# Patient Record
Sex: Male | Born: 1970 | Hispanic: No | Marital: Single | Smoking: Never smoker
Health system: Southern US, Community
[De-identification: ages and names within clinical notes are randomized; demographics above are authoritative.]

## PROBLEM LIST (undated history)

## (undated) DIAGNOSIS — R200 Anesthesia of skin: Secondary | ICD-10-CM

## (undated) DIAGNOSIS — E119 Type 2 diabetes mellitus without complications: Secondary | ICD-10-CM

## (undated) DIAGNOSIS — E7800 Pure hypercholesterolemia, unspecified: Secondary | ICD-10-CM

## (undated) HISTORY — DX: Pure hypercholesterolemia, unspecified: E78.00

## (undated) HISTORY — DX: Type 2 diabetes mellitus without complications: E11.9

## (undated) HISTORY — DX: Anesthesia of skin: R20.0

---

## 2013-02-14 DIAGNOSIS — I639 Cerebral infarction, unspecified: Secondary | ICD-10-CM

## 2013-02-14 HISTORY — DX: Cerebral infarction, unspecified: I63.9

## 2021-03-02 ENCOUNTER — Other Ambulatory Visit: Payer: Self-pay

## 2021-03-02 ENCOUNTER — Encounter: Payer: Self-pay | Admitting: Podiatry

## 2021-03-02 ENCOUNTER — Ambulatory Visit (INDEPENDENT_AMBULATORY_CARE_PROVIDER_SITE_OTHER): Payer: 59 | Admitting: Podiatry

## 2021-03-02 DIAGNOSIS — B353 Tinea pedis: Secondary | ICD-10-CM | POA: Diagnosis not present

## 2021-03-02 NOTE — Progress Notes (Signed)
°  Subjective:  Patient ID: Kenneth Ramirez, male    DOB: 1970/07/21,   MRN: [id number]  Chief Complaint  Patient presents with   Foot Problem    Pt is concerned due to possible foot fungus in between 4th and 5th toe.Has been having this problem for 3 yrs. Has been using "honey" from pharmacy    51 y.o. male presents for possible fungus on her right foot between her fourth and fifth toes for about 2 years. Has been using a "honey" cream but does not know if it has helped.  Relates he is diabetic and his A1c was high but does not recall number and not documented in the carts. Denies any other pedal complaints. Denies n/v/f/c.   History reviewed. No pertinent past medical history.  Objective:  Physical Exam: Vascular: DP/PT pulses 2/4 bilateral. CFT <3 seconds. Normal hair growth on digits. No edema.  Skin. No lacerations or abrasions bilateral feet. Maceration noted between bilateral fourth interspace and right third interspace.   Musculoskeletal: MMT 5/5 bilateral lower extremities in DF, PF, Inversion and Eversion. Deceased ROM in DF of ankle joint.  Neurological: Sensation intact to light touch.   Assessment:   1. Tinea pedis of both feet      Plan:  Patient was evaluated and treated and all questions answered. -Examined patient -Discussed treatment options for tinea pedis and maceration -Recommend betadine in between the affected areas for the next three weeks.  Will foll-up in 3 weeks to re-evaluate and see if we need to start a fungal cream   Louann Sjogren, DPM

## 2021-03-09 NOTE — Progress Notes (Signed)
No show

## 2021-03-10 ENCOUNTER — Ambulatory Visit: Payer: Self-pay | Admitting: Cardiology

## 2021-03-10 DIAGNOSIS — Z8673 Personal history of transient ischemic attack (TIA), and cerebral infarction without residual deficits: Secondary | ICD-10-CM | POA: Insufficient documentation

## 2021-03-10 DIAGNOSIS — E119 Type 2 diabetes mellitus without complications: Secondary | ICD-10-CM | POA: Insufficient documentation

## 2021-03-10 DIAGNOSIS — R0609 Other forms of dyspnea: Secondary | ICD-10-CM | POA: Insufficient documentation

## 2021-03-10 DIAGNOSIS — E782 Mixed hyperlipidemia: Secondary | ICD-10-CM | POA: Insufficient documentation

## 2021-03-19 NOTE — Progress Notes (Deleted)
Date:  03/19/2021   ID:  Kenneth Ramirez, DOB 14-Aug-1970, MRN [id number]  PCP:  Lubertha Sayres, PA  Cardiologist:  Roslyn Smiling, DO, [facility] (established care 03/22/2021) Former Cardiology Providers: ***  REASON FOR CONSULT: Dyspnea  REQUESTING PHYSICIAN:  Shanon Rosser, PA-C [place],   [phone]  No chief complaint on file.   HPI  Kenneth Ramirez is a 51 y.o. *** male who presents to the office with a chief complaint of "***." Patient's past medical history and cardiovascular risk factors include: ***  He is referred to the office at the request of [place], PA-C for evaluation of dyspnea.  ***  History of  Denies prior history of coronary artery disease, myocardial infarction, congestive heart failure, deep venous thrombosis, pulmonary embolism, stroke, transient ischemic attack.  FUNCTIONAL STATUS: ***   ALLERGIES: Not on File  MEDICATION LIST PRIOR TO VISIT: No outpatient medications have been marked as taking for the 03/22/21 encounter (Appointment) with Rex Kras, DO.     PAST MEDICAL HISTORY: No past medical history on file.  PAST SURGICAL HISTORY: No past surgical history on file.  FAMILY HISTORY: The patient family history is not on file.  SOCIAL HISTORY:  The patient    REVIEW OF SYSTEMS: ROS  PHYSICAL EXAM: No flowsheet data found.  CONSTITUTIONAL: Well-developed and well-nourished. No acute distress.  SKIN: Skin is warm and dry. No rash noted. No cyanosis. No pallor. No jaundice HEAD: Normocephalic and atraumatic.  EYES: No scleral icterus MOUTH/THROAT: Moist oral membranes.  NECK: No JVD present. No thyromegaly noted. No carotid bruits  LYMPHATIC: No visible cervical adenopathy.  CHEST Normal respiratory effort. No intercostal retractions  LUNGS: ***Clear to auscultation bilaterally.  No stridor. No wheezes. No rales.  CARDIOVASCULAR: ***Regular rate and rhythm, positive S1-S2, no murmurs rubs or gallops  appreciated. ABDOMINAL: *** No apparent ascites.  EXTREMITIES: No peripheral edema, warm to touch, ***DP and PT pulses HEMATOLOGIC: No significant bruising NEUROLOGIC: Oriented to person, place, and time. Nonfocal. Normal muscle tone.  PSYCHIATRIC: Normal mood and affect. Normal behavior. Cooperative  CARDIAC DATABASE: EKG: ***  Echocardiogram: No results found for this or any previous visit from the past 1095 days.    Stress Testing: No results found for this or any previous visit from the past 1095 days.   Heart Catheterization: None  LABORATORY DATA: No flowsheet data found.  No flowsheet data found.  Lipid Panel  No results found for: CHOL, TRIG, HDL, CHOLHDL, VLDL, LDLCALC, LDLDIRECT, LABVLDL  No components found for: NTPROBNP No results for input(s): PROBNP in the last 8760 hours. No results for input(s): TSH in the last 8760 hours.  BMP No results for input(s): NA, K, CL, CO2, GLUCOSE, BUN, CREATININE, CALCIUM, GFRNONAA, GFRAA in the last 8760 hours.  HEMOGLOBIN A1C No results found for: HGBA1C, MPG  External Labs:  Date Collected: 02/16/2021 , information obtained by *** Potassium: 4.5 Creatinine 0.92 mg/dL. eGFR: 101 mL/min per 1.73 m Hemoglobin: 15.3 g/dL and hematocrit: 45.7 % Lipid profile: Total cholesterol 282 , triglycerides 151 , HDL 45 , LDL 206 AST: 17 , ALT: 20 , alkaline phosphatase: 107  Hemoglobin A1c: *** TSH: 1.44    IMPRESSION:  No diagnosis found.   RECOMMENDATIONS: Kenneth Ramirez is a 51 y.o. *** male whose past medical history and cardiac risk factors include: ***   FINAL MEDICATION LIST END OF ENCOUNTER: No orders of the defined types were placed in this encounter.   There are no discontinued medications.  Current Outpatient Medications:    Cholecalciferol 1.25 MG (50000 UT) capsule, cholecalciferol (vitamin D3) 1,250 mcg (50,000 unit) capsule  Take 1 capsule every week by oral route., Disp: , Rfl:    clotrimazole  (LOTRIMIN) 1 % cream, clotrimazole 1 % topical cream  APPLY TO THE AFFECTED AND SURROUNDING AREAS OF SKIN BY TOPICAL ROUTE 2 TIMES PER DAY IN THE MORNING AND EVENING, Disp: , Rfl:    empagliflozin (JARDIANCE) 10 MG TABS tablet, Jardiance 10 mg tablet  Take 1 tablet every day by oral route., Disp: , Rfl:    metFORMIN (GLUCOPHAGE) 500 MG tablet, metformin 500 mg tablet  Take 1 tablet twice a day by oral route., Disp: , Rfl:    rosuvastatin (CRESTOR) 20 MG tablet, rosuvastatin 20 mg tablet  Take 1 tablet every day by oral route., Disp: , Rfl:   No orders of the defined types were placed in this encounter.   There are no Patient Instructions on file for this visit.   --Continue cardiac medications as reconciled in final medication list. --No follow-ups on file. Or sooner if needed. --Continue follow-up with your primary care physician regarding the management of your other chronic comorbid conditions.  Patient's questions and concerns were addressed to his satisfaction. He voices understanding of the instructions provided during this encounter.   This note was created using a voice recognition software as a result there may be grammatical errors inadvertently enclosed that do not reflect the nature of this encounter. Every attempt is made to correct such errors.  Rex Kras, [place], [facility]  Pager: [phone] Office: [phone]

## 2021-03-22 ENCOUNTER — Ambulatory Visit: Payer: 59 | Admitting: Cardiology

## 2021-03-23 ENCOUNTER — Ambulatory Visit (INDEPENDENT_AMBULATORY_CARE_PROVIDER_SITE_OTHER): Payer: 59 | Admitting: Podiatry

## 2021-03-23 ENCOUNTER — Other Ambulatory Visit: Payer: Self-pay

## 2021-03-23 ENCOUNTER — Encounter: Payer: Self-pay | Admitting: Podiatry

## 2021-03-23 DIAGNOSIS — B353 Tinea pedis: Secondary | ICD-10-CM

## 2021-03-23 NOTE — Progress Notes (Signed)
°  Subjective:  Patient ID: Kenneth Ramirez, male    DOB: 02-02-1971,   MRN: [id number]  Chief Complaint  Patient presents with   Nail Problem    Nail fungus follow up     51 y.o. male presents for follow-up of tinea pedis and maceration in interspaces. Has been using betadine twice a day.   Relates he is diabetic and his A1c was high but does not recall number and not documented in the carts. Denies any other pedal complaints. Denies n/v/f/c.   History reviewed. No pertinent past medical history.  Objective:  Physical Exam: Vascular: DP/PT pulses 2/4 bilateral. CFT <3 seconds. Normal hair growth on digits. No edema.  Skin. No lacerations or abrasions bilateral feet. Maceration noted between bilateral fourth interspace and bilateral  third interspace.  Does appear improved.  Musculoskeletal: MMT 5/5 bilateral lower extremities in DF, PF, Inversion and Eversion. Deceased ROM in DF of ankle joint.  Neurological: Sensation intact to light touch.   Assessment:   1. Tinea pedis of both feet       Plan:  Patient was evaluated and treated and all questions answered. -Examined patient -Discussed treatment options for tinea pedis and maceration -Continue betadine in between the affected areas for the next three weeks.  Will foll-up in 2 months to re-evaluate and see if we need to start a fungal cream   Lorenda Peck, DPM

## 2021-05-04 ENCOUNTER — Encounter: Payer: Self-pay | Admitting: *Deleted

## 2021-05-10 ENCOUNTER — Ambulatory Visit (INDEPENDENT_AMBULATORY_CARE_PROVIDER_SITE_OTHER): Payer: 59 | Admitting: Diagnostic Neuroimaging

## 2021-05-10 ENCOUNTER — Encounter: Payer: Self-pay | Admitting: Diagnostic Neuroimaging

## 2021-05-10 VITALS — BP 130/76 | HR 75 | Ht 61.0 in | Wt 181.0 lb

## 2021-05-10 DIAGNOSIS — R414 Neurologic neglect syndrome: Secondary | ICD-10-CM | POA: Diagnosis not present

## 2021-05-10 DIAGNOSIS — R480 Dyslexia and alexia: Secondary | ICD-10-CM | POA: Diagnosis not present

## 2021-05-10 DIAGNOSIS — R488 Other symbolic dysfunctions: Secondary | ICD-10-CM

## 2021-05-10 NOTE — Progress Notes (Signed)
? ?[facility] ? ?PATIENT: Kenneth Ramirez ?DOB: 1970/11/06 ? ?REFERRING CLINICIAN: Lovell Sheehan, PA ?HISTORY FROM: patient  ?REASON FOR VISIT: new consult  ? ? ?HISTORICAL ? ?CHIEF COMPLAINT:  ?Chief Complaint  ?Patient presents with  ? Numbness  ?  Rm 7 New Pt  niece- Avrar, interpreter- Bedor  "numbness was in left arm, difficulty gripping and movement, currently is better"   ? ? ?HISTORY OF PRESENT ILLNESS:  ? ?51 year old male here for evaluation of post stroke symptoms. ? ?2006 patient had fever and some speech and reading difficulty.  In 2007 he had more significant left hand weakness, difficulty writing numbers, difficulty with names, veering to the left side, problems with numbers and calculations, right left confusion.  Patient was living in [place] at the time and had MRI of the brain which showed acute stroke and chronic stroke.  He was treated medically. ? ?Since that time symptoms have continued.  Having some fluctuating numbness in left hand.  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  Now patient living in the [place] with his niece and sister. ? ? ? ?REVIEW OF SYSTEMS: Full 14 system review of systems performed and negative with exception of: as per HPI. ? ?ALLERGIES: ?No Known Allergies ? ?HOME MEDICATIONS: ?Outpatient Medications Prior to Visit  ?Medication Sig Dispense Refill  ? Cholecalciferol 1.25 MG (50000 UT) capsule cholecalciferol (vitamin D3) 1,250 mcg (50,000 unit) capsule ? Take 1 capsule every week by oral route.    ? clotrimazole (LOTRIMIN) 1 % cream clotrimazole 1 % topical cream ? APPLY TO THE AFFECTED AND SURROUNDING AREAS OF SKIN BY TOPICAL ROUTE 2 TIMES PER DAY IN THE MORNING AND EVENING    ? empagliflozin (JARDIANCE) 10 MG TABS tablet Jardiance 10 mg tablet ? Take 1 tablet every day by oral route.    ? metFORMIN (GLUCOPHAGE) 500 MG tablet metformin 500 mg tablet ? Take 1 tablet twice a day by  oral route.    ? rosuvastatin (CRESTOR) 20 MG tablet rosuvastatin 20 mg tablet ? Take 1 tablet every day by oral route.    ? ?No facility-administered medications prior to visit.  ? ? ?PAST MEDICAL HISTORY: ?Past Medical History:  ?Diagnosis Date  ? Anesthesia of skin   ? Diabetes (HCC)   ? Hypercholesteremia   ? Stroke [facility]) 2015  ? ? ?PAST SURGICAL HISTORY: ?History reviewed. No pertinent surgical history. ? ?FAMILY HISTORY: ?Family History  ?Problem Relation Age of Onset  ? Diabetes Father   ? ? ?SOCIAL HISTORY: ?Social History  ? ?Socioeconomic History  ? Marital status: Single  ?  Spouse name: Not on file  ? Number of children: 0  ? Years of education: Not on file  ? Highest education level: Not on file  ?Occupational History  ?  Comment: working  ?Tobacco Use  ? Smoking status: Never  ? Smokeless tobacco: Never  ?Substance and Sexual Activity  ? Alcohol use: Never  ? Drug use: Never  ? Sexual activity: Not on file  ?Other Topics Concern  ? Not on file  ?Social History Narrative  ? 05/10/21 Lives with family  ? ?Social Determinants of Health  ? ?Financial Resource Strain: Not on file  ?Food Insecurity: Not on file  ?Transportation Needs: Not on file  ?Physical Activity: Not on file  ?Stress: Not on file  ?Social Connections: Not on file  ?Intimate Partner Violence: Not on file  ? ? ? ?PHYSICAL  EXAM ? ?GENERAL EXAM/CONSTITUTIONAL: ?Vitals:  ?Vitals:  ? 05/10/21 1143  ?BP: 130/76  ?Pulse: 75  ?Weight: 181 lb (82.1 kg)  ?Height: 5\' 1"  (1.549 m)  ? ?Body mass index is 34.2 kg/m?. ?Wt Readings from Last 3 Encounters:  ?05/10/21 181 lb (82.1 kg)  ? ?Patient is in no distress; well developed, nourished and groomed; neck is supple ? ?CARDIOVASCULAR: ?Examination of carotid arteries is normal; no carotid bruits ?Regular rate and rhythm, no murmurs ?Examination of peripheral vascular system by observation and palpation is normal ? ?EYES: ?Ophthalmoscopic exam of optic discs and posterior segments is normal; no papilledema  or hemorrhages ?No results found. ? ?MUSCULOSKELETAL: ?Gait, strength, tone, movements noted in Neurologic exam below ? ?NEUROLOGIC: ?MENTAL STATUS:  ?   ? View : No data to display.  ?  ?  ?  ? ?awake, alert, oriented to person ?ABLE TO UNDERSTAND SOME ENGLISH; OTHER LANGUAGE VIA NIECE AND INTERPRETER IN ARABIC LANGUAGE ?fund of knowledge appropriate ? ?CRANIAL NERVE:  ?2nd - no papilledema on fundoscopic exam ?2nd, 3rd, 4th, 6th - pupils equal and reactive to light, visual fields full to confrontation, extraocular muscles intact, no nystagmus ?5th - facial sensation symmetric ?7th - facial strength symmetric ?8th - hearing intact ?9th - palate elevates symmetrically, uvula midline ?11th - shoulder shrug symmetric ?12th - tongue protrusion midline ? ?MOTOR:  ?normal bulk and tone, full strength in the BUE, BLE ? ?SENSORY:  ?normal and symmetric to light touch, temperature, vibration ? ?COORDINATION:  ?finger-nose-finger, fine finger movements normal ? ?REFLEXES:  ?deep tendon reflexes present and symmetric ? ?GAIT/STATION:  ?narrow based gait ? ? ? ? ?DIAGNOSTIC DATA (LABS, IMAGING, TESTING) ?- I reviewed patient records, labs, notes, testing and imaging myself where available. ? ?No results found for: WBC, HGB, HCT, MCV, PLT ?No results found for: NA, K, CL, CO2, GLUCOSE, BUN, CREATININE, CALCIUM, PROT, ALBUMIN, AST, ALT, ALKPHOS, BILITOT, GFRNONAA, GFRAA ?No results found for: CHOL, HDL, LDLCALC, LDLDIRECT, TRIG, CHOLHDL ?No results found for: HGBA1C ?No results found for: VITAMINB12 ?No results found for: TSH ? ? ? ? ?ASSESSMENT AND PLAN ? ?51 y.o. year old male here with: ? ? ?Dx: ? ?1. Dyslexia and alexia   ?2. Acalculia   ?3. Left-sided neglect   ? ? ?PLAN: ? ?RIGHT BRAIN STROKE (right parietal; 2007) ?- continue aspirin 81, rosuvastatin, metformin ?- check MRI brain (worsening sxs) ? ?LEFT HAND NUMBNESS (intermittent, mild) ?- could be mild left carpal tunnel syndrome; consider left wrist splint at bedtime   ? ?Return for pending if symptoms worsen or fail to improve, pending test results. ? ? ? ?2008, MD 05/10/2021, 12:09 PM ?Certified in Neurology, Neurophysiology and Neuroimaging ? ?[facility] ?[address], [address] ?[place], [place] ?[phone] ? ?

## 2021-05-10 NOTE — Patient Instructions (Signed)
?  RIGHT BRAIN STROKE (right parietal) ?- continue aspirin 81, rosuvastatin, metformin ?- check MRI brain (worsening sxs) ? ?LEFT HAND NUMBNESS (intermittent, mild) ?- could be mild left carpal tunnel syndrome; consider left wrist splint at bedtime  ?

## 2021-05-12 ENCOUNTER — Telehealth: Payer: Self-pay | Admitting: Diagnostic Neuroimaging

## 2021-05-12 NOTE — Telephone Encounter (Signed)
[facility] pending faxed notes 

## 2021-05-20 NOTE — Telephone Encounter (Signed)
Correction the order is sent to [facility], they will reach out to the patient to schedule.  ?

## 2021-05-20 NOTE — Telephone Encounter (Signed)
[facility] auth: [id number] (exp. 05/12/21 to 08/12/21) order sent to [facility], they will reach out to the patient to schedule.  ?

## 2021-05-25 ENCOUNTER — Ambulatory Visit (INDEPENDENT_AMBULATORY_CARE_PROVIDER_SITE_OTHER): Payer: 59 | Admitting: Podiatry

## 2021-05-25 ENCOUNTER — Encounter: Payer: Self-pay | Admitting: Podiatry

## 2021-05-25 DIAGNOSIS — B353 Tinea pedis: Secondary | ICD-10-CM

## 2021-05-25 MED ORDER — KETOCONAZOLE 2 % EX CREA: 1.0000 "application " | TOPICAL_CREAM | Freq: Every day | CUTANEOUS | 2 refills | Status: DC

## 2021-05-25 NOTE — Progress Notes (Signed)
?  Subjective:  ?Patient ID: Slayton Lubitz, male    DOB: 08/06/1970,   MRN: [id number] ? ?Chief Complaint  ?Patient presents with  ? Nail Problem  ? ? ?51 y.o. male presents for follow-up of tinea pedis and maceration in interspaces. Has been using betadine twice a day.   Relates he is diabetic and his A1c was high but does not recall number and not documented in the charts. Denies any other pedal complaints. Denies n/v/f/c.  ? ?Past Medical History:  ?Diagnosis Date  ? Anesthesia of skin   ? Diabetes (HCC)   ? Hypercholesteremia   ? Stroke [facility]) 2015  ? ? ?Objective:  ?Physical Exam: ?Vascular: DP/PT pulses 2/4 bilateral. CFT <3 seconds. Normal hair growth on digits. No edema.  ?Skin. No lacerations or abrasions bilateral feet. Maceration noted between bilateral fourth interspace and bilateral  third interspace.  Does appear improved.  ?Musculoskeletal: MMT 5/5 bilateral lower extremities in DF, PF, Inversion and Eversion. Deceased ROM in DF of ankle joint.  ?Neurological: Sensation intact to light touch.  ? ?Assessment:  ? ?1. Tinea pedis of both feet   ? ? ? ? ?Plan:  ?Patient was evaluated and treated and all questions answered. ?-Examined patient ?-Discussed treatment options for tinea pedis and maceration ?-Continue betadine in between the affected areas for the next three weeks in the morning and will add on ketoconazole at night.  ?-Ketoconazole provided.  ?Will foll-up in 3 weeks to see how doing.  ? ? ?Louann Sjogren, DPM  ? ? ?

## 2021-06-05 ENCOUNTER — Ambulatory Visit (HOSPITAL_COMMUNITY)
Admission: RE | Admit: 2021-06-05 | Discharge: 2021-06-05 | Disposition: A | Discharge status: Home / Self Care | Payer: 59 | Source: Ambulatory Visit | Attending: Diagnostic Neuroimaging | Admitting: Diagnostic Neuroimaging

## 2021-06-05 DIAGNOSIS — R480 Dyslexia and alexia: Secondary | ICD-10-CM | POA: Diagnosis present

## 2021-06-05 DIAGNOSIS — R488 Other symbolic dysfunctions: Secondary | ICD-10-CM | POA: Diagnosis present

## 2021-06-05 DIAGNOSIS — R414 Neurologic neglect syndrome: Secondary | ICD-10-CM

## 2021-06-14 ENCOUNTER — Encounter: Payer: Self-pay | Admitting: *Deleted

## 2021-06-15 ENCOUNTER — Ambulatory Visit (INDEPENDENT_AMBULATORY_CARE_PROVIDER_SITE_OTHER): Payer: 59 | Admitting: Podiatry

## 2021-06-15 ENCOUNTER — Encounter: Payer: Self-pay | Admitting: Podiatry

## 2021-06-15 DIAGNOSIS — B353 Tinea pedis: Secondary | ICD-10-CM | POA: Diagnosis not present

## 2021-06-15 NOTE — Progress Notes (Signed)
?  Subjective:  ?Patient ID: Kenneth Ramirez, male    DOB: 05/26/1970,   MRN: [id number] ? ?Chief Complaint  ?Patient presents with  ? Skin Problem  ?  Follow up tinea bilateral   "It is much improved"  ? ? ?51 y.o. male presents for follow-up of tinea pedis and maceration in interspaces. Has been using betadine and ketoconazole.   States it is doing much better. Relates he is diabetic and his A1c was high but does not recall number and not documented in the charts. Denies any other pedal complaints. Denies n/v/f/c.  ? ?Past Medical History:  ?Diagnosis Date  ? Anesthesia of skin   ? Diabetes (HCC)   ? Hypercholesteremia   ? Stroke [facility]) 2015  ? ? ?Objective:  ?Physical Exam: ?Vascular: DP/PT pulses 2/4 bilateral. CFT <3 seconds. Normal hair growth on digits. No edema.  ?Skin. No lacerations or abrasions bilateral feet. Maceration noted between bilateral fourth interspace and bilateral  third interspace.  Improved. ?Musculoskeletal: MMT 5/5 bilateral lower extremities in DF, PF, Inversion and Eversion. Deceased ROM in DF of ankle joint.  ?Neurological: Sensation intact to light touch.  ? ?Assessment:  ? ?1. Tinea pedis of both feet   ? ? ? ? ? ?Plan:  ?Patient was evaluated and treated and all questions answered. ?-Examined patient ?-Discussed treatment options for tinea pedis and maceration ?-Continue betadine in between the affected areas for the next three weeks in the morning and continue ketoconazole at night as needed.  ?Will follow-up as needed.  ? ? ?Louann Sjogren, DPM  ? ? ?

## 2021-06-21 ENCOUNTER — Encounter: Payer: Self-pay | Admitting: Nurse Practitioner

## 2021-06-21 ENCOUNTER — Ambulatory Visit: Payer: 59 | Attending: Nurse Practitioner | Admitting: Nurse Practitioner

## 2021-06-21 VITALS — BP 123/82 | HR 64 | Temp 98.5°F | Resp 12 | Ht 62.75 in | Wt 173.0 lb

## 2021-06-21 DIAGNOSIS — E1165 Type 2 diabetes mellitus with hyperglycemia: Secondary | ICD-10-CM | POA: Diagnosis not present

## 2021-06-21 DIAGNOSIS — Z1159 Encounter for screening for other viral diseases: Secondary | ICD-10-CM | POA: Diagnosis not present

## 2021-06-21 DIAGNOSIS — Z1211 Encounter for screening for malignant neoplasm of colon: Secondary | ICD-10-CM

## 2021-06-21 DIAGNOSIS — E559 Vitamin D deficiency, unspecified: Secondary | ICD-10-CM | POA: Diagnosis not present

## 2021-06-21 DIAGNOSIS — Z23 Encounter for immunization: Secondary | ICD-10-CM

## 2021-06-21 DIAGNOSIS — Z114 Encounter for screening for human immunodeficiency virus [HIV]: Secondary | ICD-10-CM

## 2021-06-21 DIAGNOSIS — Z8673 Personal history of transient ischemic attack (TIA), and cerebral infarction without residual deficits: Secondary | ICD-10-CM

## 2021-06-21 DIAGNOSIS — E7800 Pure hypercholesterolemia, unspecified: Secondary | ICD-10-CM

## 2021-06-21 MED ORDER — METFORMIN HCL 500 MG PO TABS: 500.0000 mg | ORAL_TABLET | Freq: Two times a day (BID) | ORAL | 1 refills | Status: DC

## 2021-06-21 MED ORDER — EMPAGLIFLOZIN 10 MG PO TABS: 10.0000 mg | ORAL_TABLET | Freq: Every day | ORAL | 1 refills | Status: DC

## 2021-06-21 MED ORDER — CHOLECALCIFEROL 1.25 MG (50000 UT) PO CAPS: 50000.0000 [IU] | ORAL_CAPSULE | ORAL | 0 refills | Status: AC

## 2021-06-21 MED ORDER — ASPIRIN EC 81 MG PO TBEC: 81.0000 mg | DELAYED_RELEASE_TABLET | Freq: Every day | ORAL | 3 refills | Status: AC

## 2021-06-21 MED ORDER — ROSUVASTATIN CALCIUM 20 MG PO TABS: 20.0000 mg | ORAL_TABLET | Freq: Every day | ORAL | 3 refills | Status: DC

## 2021-06-21 NOTE — Progress Notes (Signed)
? ?Assessment & Plan:  ?Kenneth Ramirez was seen today for establish care. ? ?Diagnoses and all orders for this visit: ? ?Type 2 diabetes mellitus with hyperglycemia, without long-term current use of insulin ([place]) ?-     CMP14+EGFR ?-     Ambulatory referral to Ophthalmology ?-     Microalbumin / creatinine urine ratio ?-     empagliflozin (JARDIANCE) 10 MG TABS tablet; Take 1 tablet (10 mg total) by mouth daily. ?-     metFORMIN (GLUCOPHAGE) 500 MG tablet; Take 1 tablet (500 mg total) by mouth 2 (two) times daily with a meal. ?-     Hemoglobin A1c ? ?Hypercholesterolemia ?-     Lipid panel ?-     rosuvastatin (CRESTOR) 20 MG tablet; Take 1 tablet (20 mg total) by mouth daily. ? ?Vitamin D deficiency disease ?-     VITAMIN D 25 Hydroxy (Vit-D Deficiency, Fractures) ?-     Cholecalciferol 1.25 MG (50000 UT) capsule; Take 1 capsule (50,000 Units total) by mouth once a week. ? ?Need for hepatitis C screening test ?-     HCV Ab w Reflex to Quant PCR ? ?Encounter for screening for HIV ?-     HIV antibody (with reflex) ? ?Colon cancer screening ?-     Fecal occult blood, imunochemical ? ?Need for shingles vaccine ?-     Varicella-zoster vaccine IM (Shingrix) ? ?History of CVA (cerebrovascular accident) ?-     aspirin EC 81 MG tablet; Take 1 tablet (81 mg total) by mouth daily. Swallow whole. ? ? ? ?Patient has been counseled on age-appropriate routine health concerns for screening and prevention. These are reviewed and up-to-date. Referrals have been placed accordingly. Immunizations are up-to-date or declined.    ?Subjective:  ? ?Chief Complaint  ?Patient presents with  ? Establish Care  ? ?HPI ?Kenneth Ramirez 50 y.o. male presents to office today to establish care. ?He is accompanied by an onsite [place] interpreter.  He has a history of diabetes mellitus, hyperlipidemia and stroke (mild residual left hand weakness). ? ?DM 2 ?Notes postprandial blood glucose levels 160. Only checks one time per day. Diabetes is not well  controlled.  He endorses medication adherence taking Jardiance 10 mg daily and metformin 500 mg twice daily. ?Last noted A1c on 02-21-2021: 9.1 ? ?HPL ?Last noted 02-21-2021: LDL 206, triglycerides 151, HDL 45, total cholesterol 282 ?He endorses adherence with high intensity statin Crestor 20 mg daily. ?  ?Review of Systems  ?Constitutional:  Negative for fever, malaise/fatigue and weight loss.  ?HENT: Negative.  Negative for nosebleeds.   ?Eyes: Negative.  Negative for blurred vision, double vision and photophobia.  ?Respiratory: Negative.  Negative for cough and shortness of breath.   ?Cardiovascular: Negative.  Negative for chest pain, palpitations and leg swelling.  ?Gastrointestinal: Negative.  Negative for heartburn, nausea and vomiting.  ?Musculoskeletal: Negative.  Negative for myalgias.  ?Neurological:  Positive for focal weakness (left hand). Negative for dizziness, seizures and headaches.  ?Psychiatric/Behavioral: Negative.  Negative for suicidal ideas.   ? ? ?Past Medical History:  ?Diagnosis Date  ? Anesthesia of skin   ? Diabetes ([place])   ? Hypercholesteremia   ? Stroke [facility]) 2015  ? ? ?History reviewed. No pertinent surgical history. ? ?Family History  ?Problem Relation Age of Onset  ? Diabetes Father   ? ? ?Social History Reviewed with no changes to be made today.  ? ?Outpatient Medications Prior to Visit  ?Medication Sig Dispense Refill  ? clotrimazole (  LOTRIMIN) 1 % cream clotrimazole 1 % topical cream ? APPLY TO THE AFFECTED AND SURROUNDING AREAS OF SKIN BY TOPICAL ROUTE 2 TIMES PER DAY IN THE MORNING AND EVENING    ? ketoconazole (NIZORAL) 2 % cream Apply 1 application. topically daily. 60 g 2  ? aspirin EC 81 MG tablet Take 81 mg by mouth daily. Swallow whole.    ? Cholecalciferol 1.25 MG (50000 UT) capsule cholecalciferol (vitamin D3) 1,250 mcg (50,000 unit) capsule ? Take 1 capsule every week by oral route.    ? empagliflozin (JARDIANCE) 10 MG TABS tablet Jardiance 10 mg tablet ? Take 1 tablet  every day by oral route.    ? metFORMIN (GLUCOPHAGE) 500 MG tablet metformin 500 mg tablet ? Take 1 tablet twice a day by oral route.    ? rosuvastatin (CRESTOR) 20 MG tablet rosuvastatin 20 mg tablet ? Take 1 tablet every day by oral route.    ? ?No facility-administered medications prior to visit.  ? ? ?No Known Allergies ? ?   ?Objective:  ?  ?BP 123/82 (BP Location: Right Arm, Patient Position: Sitting, Cuff Size: Normal)   Pulse 64   Temp 98.5 ?F (36.9 ?C) (Oral)   Resp 12   Ht 5' 2.75" (1.594 m)   Wt 173 lb (78.5 kg)   SpO2 100%   BMI 30.89 kg/m?  ?Wt Readings from Last 3 Encounters:  ?06/21/21 173 lb (78.5 kg)  ?05/10/21 181 lb (82.1 kg)  ? ? ?Physical Exam ?Vitals and nursing note reviewed.  ?Constitutional:   ?   Appearance: He is well-developed.  ?HENT:  ?   Head: Normocephalic and atraumatic.  ?Cardiovascular:  ?   Rate and Rhythm: Normal rate and regular rhythm.  ?   Heart sounds: Normal heart sounds. No murmur heard. ?  No friction rub. No gallop.  ?Pulmonary:  ?   Effort: Pulmonary effort is normal. No tachypnea or respiratory distress.  ?   Breath sounds: Normal breath sounds. No decreased breath sounds, wheezing, rhonchi or rales.  ?Chest:  ?   Chest wall: No tenderness.  ?Abdominal:  ?   General: Bowel sounds are normal.  ?   Palpations: Abdomen is soft.  ?Musculoskeletal:     ?   General: Normal range of motion.  ?   Cervical back: Normal range of motion.  ?Skin: ?   General: Skin is warm and dry.  ?Neurological:  ?   Mental Status: He is alert and oriented to person, place, and time.  ?   Coordination: Coordination normal.  ?Psychiatric:     ?   Behavior: Behavior normal. Behavior is cooperative.     ?   Thought Content: Thought content normal.     ?   Judgment: Judgment normal.  ? ? ? ? ?   ?Patient has been counseled extensively about nutrition and exercise as well as the importance of adherence with medications and regular follow-up. The patient was given clear instructions to go to ER  or return to medical center if symptoms don't improve, worsen or new problems develop. The patient verbalized understanding.  ? ?Follow-up: Return in about 3 months (around 09/21/2021).  ? ?Gildardo Pounds, FNP-BC ?[facility] ?[place], [place] ?[phone]   ?06/21/2021, 3:05 PM ?

## 2021-06-22 LAB — CMP14+EGFR
ALT: 18 IU/L (ref 0–44)
AST: 14 IU/L (ref 0–40)
Albumin/Globulin Ratio: 1.6 (ref 1.2–2.2)
Albumin: 4.7 g/dL (ref 3.8–4.9)
Alkaline Phosphatase: 96 IU/L (ref 44–121)
BUN/Creatinine Ratio: 10 (ref 9–20)
BUN: 9 mg/dL (ref 6–24)
Bilirubin Total: <0.2 mg/dL (ref 0.0–1.2)
CO2: 22 mmol/L (ref 20–29)
Calcium: 9.5 mg/dL (ref 8.7–10.2)
Chloride: 104 mmol/L (ref 96–106)
Creatinine, Ser: 0.93 mg/dL (ref 0.76–1.27)
Globulin, Total: 3 g/dL (ref 1.5–4.5)
Glucose: 90 mg/dL (ref 70–99)
Potassium: 4.4 mmol/L (ref 3.5–5.2)
Sodium: 140 mmol/L (ref 134–144)
Total Protein: 7.7 g/dL (ref 6.0–8.5)
eGFR: 99 mL/min/{1.73_m2} (ref >59)

## 2021-06-22 LAB — MICROALBUMIN / CREATININE URINE RATIO
Creatinine, Urine: 90.3 mg/dL
Microalb/Creat Ratio: 7 mg/g creat (ref 0–29)
Microalbumin, Urine: 6.1 ug/mL

## 2021-06-22 LAB — LIPID PANEL
Chol/HDL Ratio: 3.4 ratio (ref 0.0–5.0)
Cholesterol, Total: 152 mg/dL (ref 100–199)
HDL: 45 mg/dL (ref >39)
LDL Chol Calc (NIH): 88 mg/dL (ref 0–99)
Triglycerides: 101 mg/dL (ref 0–149)
VLDL Cholesterol Cal: 19 mg/dL (ref 5–40)

## 2021-06-22 LAB — VITAMIN D 25 HYDROXY (VIT D DEFICIENCY, FRACTURES): Vit D, 25-Hydroxy: 63.5 ng/mL (ref 30.0–100.0)

## 2021-06-22 LAB — HEMOGLOBIN A1C
Est. average glucose Bld gHb Est-mCnc: 151 mg/dL
Hgb A1c MFr Bld: 6.9 % — ABNORMAL HIGH (ref 4.8–5.6)

## 2021-06-22 LAB — HCV AB W REFLEX TO QUANT PCR: HCV Ab: NONREACTIVE

## 2021-06-22 LAB — HCV INTERPRETATION

## 2021-06-22 LAB — HIV ANTIBODY (ROUTINE TESTING W REFLEX): HIV Screen 4th Generation wRfx: NONREACTIVE

## 2021-06-26 LAB — FECAL OCCULT BLOOD, IMMUNOCHEMICAL: Fecal Occult Bld: NEGATIVE

## 2021-09-24 ENCOUNTER — Ambulatory Visit: Payer: 59 | Attending: Nurse Practitioner | Admitting: Nurse Practitioner

## 2021-09-24 VITALS — BP 133/86 | HR 82 | Temp 98.6°F | Resp 14 | Ht 62.0 in | Wt 179.0 lb

## 2021-09-24 DIAGNOSIS — Z0289 Encounter for other administrative examinations: Secondary | ICD-10-CM
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
## 2021-09-26 ENCOUNTER — Encounter: Payer: Self-pay | Admitting: Nurse Practitioner

## 2021-12-31 ENCOUNTER — Encounter: Payer: Self-pay | Admitting: Nurse Practitioner

## 2021-12-31 ENCOUNTER — Ambulatory Visit: Payer: Commercial Managed Care - HMO | Attending: Nurse Practitioner | Admitting: Nurse Practitioner

## 2021-12-31 VITALS — BP 133/77 | HR 75 | Ht 62.0 in | Wt 176.8 lb

## 2021-12-31 DIAGNOSIS — H538 Other visual disturbances: Secondary | ICD-10-CM

## 2021-12-31 DIAGNOSIS — E1165 Type 2 diabetes mellitus with hyperglycemia: Secondary | ICD-10-CM

## 2021-12-31 DIAGNOSIS — E7800 Pure hypercholesterolemia, unspecified: Secondary | ICD-10-CM

## 2021-12-31 DIAGNOSIS — R5383 Other fatigue: Secondary | ICD-10-CM

## 2021-12-31 DIAGNOSIS — B353 Tinea pedis: Secondary | ICD-10-CM

## 2021-12-31 LAB — POCT GLYCOSYLATED HEMOGLOBIN (HGB A1C): HbA1c, POC (controlled diabetic range): 6.6 % (ref 0.0–7.0)

## 2021-12-31 MED ORDER — NYSTATIN 100000 UNIT/GM EX POWD: 1.0000 | Freq: Three times a day (TID) | CUTANEOUS | 1 refills | Status: DC

## 2021-12-31 MED ORDER — EMPAGLIFLOZIN 10 MG PO TABS: 10.0000 mg | ORAL_TABLET | Freq: Every day | ORAL | 1 refills | Status: DC

## 2021-12-31 MED ORDER — METFORMIN HCL 500 MG PO TABS: 500.0000 mg | ORAL_TABLET | Freq: Two times a day (BID) | ORAL | 1 refills | Status: DC

## 2021-12-31 MED ORDER — ROSUVASTATIN CALCIUM 20 MG PO TABS: 20.0000 mg | ORAL_TABLET | Freq: Every day | ORAL | 3 refills | Status: DC

## 2021-12-31 NOTE — Patient Instructions (Signed)
Over the counter medications for insomnia  Ashwaghanda  Sleepy time Tea  Melatonin 2 tablets  Tylenol pm

## 2021-12-31 NOTE — Progress Notes (Signed)
Assessment & Plan:  Kenneth Ramirez was seen today for diabetes.  Diagnoses and all orders for this visit:  Type 2 diabetes mellitus with hyperglycemia, without long-term current use of insulin (HCC) -     POCT glycosylated hemoglobin (Hb A1C) -     metFORMIN (GLUCOPHAGE) 500 MG tablet; Take 1 tablet (500 mg total) by mouth 2 (two) times daily with a meal. -     empagliflozin (JARDIANCE) 10 MG TABS tablet; Take 1 tablet (10 mg total) by mouth daily. -     CMP14+EGFR Continue blood sugar control as discussed in office today, low carbohydrate diet, and regular physical exercise as tolerated, 150 minutes per week (30 min each day, 5 days per week, or 50 min 3 days per week). Keep blood sugar logs with fasting goal of 90-130 mg/dl, post prandial (after you eat) less than 180.  For Hypoglycemia: BS <60 and Hyperglycemia BS >400; contact the clinic ASAP. Annual eye exams and foot exams are recommended.   Hypercholesterolemia -     rosuvastatin (CRESTOR) 20 MG tablet; Take 1 tablet (20 mg total) by mouth daily. INSTRUCTIONS: Work on a low fat, heart healthy diet and participate in regular aerobic exercise program by working out at least 150 minutes per week; 5 days a week-30 minutes per day. Avoid red meat/beef/steak,  fried foods. junk foods, sodas, sugary drinks, unhealthy snacking, alcohol and smoking.  Drink at least 80 oz of water per day and monitor your carbohydrate intake daily.    Tinea pedis of both feet -     Ambulatory referral to Podiatry -     nystatin powder; Apply 1 Application topically 3 (three) times daily.  Blurred vision, bilateral -     Ambulatory referral to Ophthalmology  Other fatigue -     CBC with Differential    Patient has been counseled on age-appropriate routine health concerns for screening and prevention. These are reviewed and up-to-date. Referrals have been placed accordingly. Immunizations are up-to-date or declined.    Subjective:   Chief Complaint  Patient  presents with   Diabetes   Diabetes Pertinent negatives for hypoglycemia include no dizziness, headaches or seizures. Associated symptoms include blurred vision. Pertinent negatives for diabetes include no chest pain and no weight loss.   Kenneth Ramirez 51 y.o. male presents to office today for follow up to DM  He has a past medical history of DM 2, , Hypercholesteremia, and Stroke (2015).    VRI was used to communicate directly with patient for the entire encounter including providing detailed patient instructions.    DM  Well controlled with metformin 500 mg BID and Jardiance 10 mg daily.  Lab Results  Component Value Date   HGBA1C 6.6 12/31/2021    Lab Results  Component Value Date   HGBA1C 6.9 (H) 06/21/2021   LDL not at goal with crestor 20 mg daily.  Lab Results  Component Value Date   LDLCALC 88 06/21/2021  Blood pressure is well controlled.   BP Readings from Last 3 Encounters:  12/31/21 133/77  09/24/21 133/86  06/21/21 123/82     Interdigital maceration and scale between 4th and 5th toes bilaterally. Clotrimazole and ketoconazole.   Notes increased fatigue. Getting about 4 hours of sleep at night.  He goes to bed around 12 midnight. Gets up 4 hours later for prayer. I recommended over the counter sleep aids to assist with insomnia.      Review of Systems  Constitutional:  Positive for malaise/fatigue.  Negative for fever and weight loss.  HENT: Negative.  Negative for nosebleeds.   Eyes:  Positive for blurred vision. Negative for double vision and photophobia.  Respiratory: Negative.  Negative for cough and shortness of breath.   Cardiovascular: Negative.  Negative for chest pain, palpitations and leg swelling.  Gastrointestinal: Negative.  Negative for heartburn, nausea and vomiting.  Musculoskeletal: Negative.  Negative for myalgias.  Skin:        SEE HPI: tinea pedis  Neurological: Negative.  Negative for dizziness, focal weakness, seizures and  headaches.  Psychiatric/Behavioral: Negative.  Negative for suicidal ideas.     Past Medical History:  Diagnosis Date   Anesthesia of skin    Diabetes ([place])    Hypercholesteremia    Stroke ([place]) 2015    History reviewed. No pertinent surgical history.  Family History  Problem Relation Age of Onset   Diabetes Father     Social History Reviewed with no changes to be made today.   Outpatient Medications Prior to Visit  Medication Sig Dispense Refill   aspirin EC 81 MG tablet Take 1 tablet (81 mg total) by mouth daily. Swallow whole. 90 tablet 3   clotrimazole (LOTRIMIN) 1 % cream clotrimazole 1 % topical cream  APPLY TO THE AFFECTED AND SURROUNDING AREAS OF SKIN BY TOPICAL ROUTE 2 TIMES PER DAY IN THE MORNING AND EVENING     empagliflozin (JARDIANCE) 10 MG TABS tablet Take 1 tablet (10 mg total) by mouth daily. 90 tablet 1   ketoconazole (NIZORAL) 2 % cream Apply 1 application. topically daily. 60 g 2   metFORMIN (GLUCOPHAGE) 500 MG tablet Take 1 tablet (500 mg total) by mouth 2 (two) times daily with a meal. 180 tablet 1   rosuvastatin (CRESTOR) 20 MG tablet Take 1 tablet (20 mg total) by mouth daily. 90 tablet 3   No facility-administered medications prior to visit.    No Known Allergies     Objective:    BP 133/77   Pulse 75   Ht _0  (1.575 m)   Wt 176 lb 12.8 oz (80.2 kg)   SpO2 100%   BMI 32.34 kg/m  Wt Readings from Last 3 Encounters:  12/31/21 176 lb 12.8 oz (80.2 kg)  09/24/21 179 lb (81.2 kg)  06/21/21 173 lb (78.5 kg)    Physical Exam Vitals and nursing note reviewed.  Constitutional:      Appearance: He is well-developed.  HENT:     Head: Normocephalic and atraumatic.  Cardiovascular:     Rate and Rhythm: Normal rate and regular rhythm.     Heart sounds: Normal heart sounds. No murmur heard.    No friction rub. No gallop.  Pulmonary:     Effort: Pulmonary effort is normal. No tachypnea or respiratory distress.     Breath sounds: Normal breath  sounds. No decreased breath sounds, wheezing, rhonchi or rales.  Chest:     Chest wall: No tenderness.  Abdominal:     General: Bowel sounds are normal.     Palpations: Abdomen is soft.  Musculoskeletal:        General: Normal range of motion.     Cervical back: Normal range of motion.       Feet:  Feet:     Comments: Tinea pedis bilaterally Skin:    General: Skin is warm and dry.  Neurological:     Mental Status: He is alert and oriented to person, place, and time.     Coordination: Coordination normal.  Psychiatric:        Behavior: Behavior normal. Behavior is cooperative.        Thought Content: Thought content normal.        Judgment: Judgment normal.          Patient has been counseled extensively about nutrition and exercise as well as the importance of adherence with medications and regular follow-up. The patient was given clear instructions to go to ER or return to medical center if symptoms don't improve, worsen or new problems develop. The patient verbalized understanding.   Follow-up: Return in about 6 months (around 07/01/2022).   Gildardo Pounds, FNP-BC [facility] [place], [place]   12/31/2021, 10:32 AM

## 2022-01-01 LAB — CMP14+EGFR
ALT: 16 IU/L (ref 0–44)
AST: 21 IU/L (ref 0–40)
Albumin/Globulin Ratio: 1.6 (ref 1.2–2.2)
Albumin: 4.5 g/dL (ref 3.8–4.9)
Alkaline Phosphatase: 132 IU/L — ABNORMAL HIGH (ref 44–121)
BUN/Creatinine Ratio: 15 (ref 9–20)
BUN: 15 mg/dL (ref 6–24)
Bilirubin Total: 0.4 mg/dL (ref 0.0–1.2)
CO2: 20 mmol/L (ref 20–29)
Calcium: 9.6 mg/dL (ref 8.7–10.2)
Chloride: 103 mmol/L (ref 96–106)
Creatinine, Ser: 0.98 mg/dL (ref 0.76–1.27)
Globulin, Total: 2.8 g/dL (ref 1.5–4.5)
Glucose: 86 mg/dL (ref 70–99)
Potassium: 4.2 mmol/L (ref 3.5–5.2)
Sodium: 138 mmol/L (ref 134–144)
Total Protein: 7.3 g/dL (ref 6.0–8.5)
eGFR: 93 mL/min/{1.73_m2} (ref >59)

## 2022-01-01 LAB — CBC WITH DIFFERENTIAL/PLATELET
Basophils Absolute: 0 10*3/uL (ref 0.0–0.2)
Basos: 0 %
EOS (ABSOLUTE): 0.3 10*3/uL (ref 0.0–0.4)
Eos: 5 %
Hematocrit: 45.5 % (ref 37.5–51.0)
Hemoglobin: 15.3 g/dL (ref 13.0–17.7)
Immature Grans (Abs): 0 10*3/uL (ref 0.0–0.1)
Immature Granulocytes: 0 %
Lymphocytes Absolute: 2.1 10*3/uL (ref 0.7–3.1)
Lymphs: 35 %
MCH: 30.8 pg (ref 26.6–33.0)
MCHC: 33.6 g/dL (ref 31.5–35.7)
MCV: 92 fL (ref 79–97)
Monocytes Absolute: 0.5 10*3/uL (ref 0.1–0.9)
Monocytes: 9 %
Neutrophils Absolute: 3 10*3/uL (ref 1.4–7.0)
Neutrophils: 51 %
Platelets: 237 10*3/uL (ref 150–450)
RBC: 4.96 x10E6/uL (ref 4.14–5.80)
RDW: 13.2 % (ref 11.6–15.4)
WBC: 5.9 10*3/uL (ref 3.4–10.8)

## 2022-01-13 ENCOUNTER — Ambulatory Visit (INDEPENDENT_AMBULATORY_CARE_PROVIDER_SITE_OTHER): Payer: Commercial Managed Care - HMO

## 2022-01-13 ENCOUNTER — Ambulatory Visit: Payer: Commercial Managed Care - HMO | Admitting: Podiatry

## 2022-01-13 DIAGNOSIS — M7751 Other enthesopathy of right foot: Secondary | ICD-10-CM | POA: Diagnosis not present

## 2022-01-13 DIAGNOSIS — M7752 Other enthesopathy of left foot: Secondary | ICD-10-CM

## 2022-01-13 DIAGNOSIS — M79671 Pain in right foot: Secondary | ICD-10-CM | POA: Diagnosis not present

## 2022-01-13 DIAGNOSIS — M79672 Pain in left foot: Secondary | ICD-10-CM

## 2022-01-13 MED ORDER — TRIAMCINOLONE ACETONIDE 10 MG/ML IJ SUSP: 20.0000 mg | Freq: Once | INTRAMUSCULAR | Status: AC

## 2022-01-14 ENCOUNTER — Other Ambulatory Visit: Payer: Self-pay | Admitting: Podiatry

## 2022-01-14 DIAGNOSIS — M79672 Pain in left foot: Secondary | ICD-10-CM

## 2022-01-14 DIAGNOSIS — M7752 Other enthesopathy of left foot: Secondary | ICD-10-CM

## 2022-01-14 DIAGNOSIS — M7751 Other enthesopathy of right foot: Secondary | ICD-10-CM

## 2022-01-14 NOTE — Progress Notes (Signed)
Subjective:   Patient ID: Kenneth Ramirez, male   DOB: 51 y.o.   MRN: [id number]   HPI Patient presents with interpreter with a lot of pain between the fourth and fifth digits of both feet stating that this has been going on for years and gradually worsening is tried topical medicines without success   ROS      Objective:  Physical Exam  Neurovascular status intact muscle strength found to be adequate range of motion within normal limits with patient found to have interdigital callus corn formation fourth interspace both feet painful when pressed with what appears to be bone against bone pressure and inflammatory capsulitis     Assessment:  At this point I believe that this is more of a bone structural issue versus a straight problem of fungus due to the pain he is experiencing and the length of time he is dealt with this in the same area     Plan:  Reviewed with caregiver who explained to him the condition and today went ahead and I did do capsular injections of the fourth interspace bilateral 3 mg Dexasone Kenalog 5 mg Xylocaine to reduce inflammation and then did courtesy debridement of tissue bilateral and I want to see patient back again as symptoms indicate and may require partial syndactylization  X-rays indicate that there is rotation fifth digit against the fourth toe with probable compression

## 2022-02-17 ENCOUNTER — Telehealth: Payer: Self-pay | Admitting: *Deleted

## 2022-02-17 ENCOUNTER — Telehealth: Payer: Self-pay | Admitting: Nurse Practitioner

## 2022-02-17 NOTE — Telephone Encounter (Signed)
error 

## 2022-02-17 NOTE — Telephone Encounter (Signed)
Caller would like a follow up call regarding the status of the ophthalmologist referral.

## 2022-03-23 ENCOUNTER — Telehealth: Payer: Self-pay | Admitting: Emergency Medicine

## 2022-03-23 NOTE — Telephone Encounter (Signed)
Yes a virtual visit
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
## 2022-03-24 NOTE — Telephone Encounter (Signed)
Voicemail left to return call to schedule appointment.

## 2022-03-28 ENCOUNTER — Ambulatory Visit: Payer: Self-pay | Attending: Nurse Practitioner | Admitting: Nurse Practitioner

## 2022-04-07 LAB — HM DIABETES EYE EXAM

## 2022-04-28 ENCOUNTER — Telehealth: Payer: Self-pay | Admitting: Emergency Medicine

## 2022-04-28 NOTE — Telephone Encounter (Signed)
Copied from [place] [id number]. Topic: Appointment Scheduling - Scheduling Inquiry for Clinic >> Apr 28, 2022  1:16 PM Dominique A wrote: Reason for CRM: Kenneth Ramirez (pt sister) is calling. Pt  is needing another appt with PCP before 05/21/22. There is paperwork he is needing the PCP to fill out that the [facility] is needing and he has to have it turned in before 05/21/22.  (If the paper work is not turned back in time it will cost him his drivers license.) Please call pt sister back.
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
## 2022-05-09 NOTE — Telephone Encounter (Signed)
Routing to CMA 
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
## 2022-05-11 NOTE — Telephone Encounter (Signed)
Return call unanswered.  

## 2022-05-11 NOTE — Telephone Encounter (Signed)
Patients sister Collene Leyden returned Priscilla's call. Please call sister back to let her know when she could come by to pick up paperwork for [facility]

## 2022-05-11 NOTE — Telephone Encounter (Signed)
Need paperwork 

## 2022-05-19 ENCOUNTER — Encounter: Payer: Self-pay | Admitting: Nurse Practitioner

## 2022-06-08 ENCOUNTER — Telehealth: Payer: Self-pay

## 2022-06-08 ENCOUNTER — Ambulatory Visit: Payer: Self-pay | Admitting: Physician Assistant

## 2022-06-08 ENCOUNTER — Encounter: Payer: Self-pay | Admitting: Nurse Practitioner

## 2022-06-08 NOTE — Telephone Encounter (Signed)
Called and LVM. No answer.

## 2022-06-08 NOTE — Telephone Encounter (Signed)
After explaining to the sister and patient about DMV paperwork and talking with Meredeth Ide Patient's sister is still wants paperwork changed letting him work at night.    Please contact her at  [phone]

## 2022-06-28 DIAGNOSIS — Z139 Encounter for screening, unspecified: Secondary | ICD-10-CM

## 2022-06-28 LAB — GLUCOSE, POCT (MANUAL RESULT ENTRY): POC Glucose: 171 mg/dl — AB (ref 70–99)
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
## 2022-07-01 ENCOUNTER — Encounter: Payer: Self-pay | Admitting: Nurse Practitioner

## 2022-07-01 ENCOUNTER — Ambulatory Visit: Payer: BLUE CROSS/BLUE SHIELD | Attending: Nurse Practitioner | Admitting: Nurse Practitioner

## 2022-07-01 VITALS — BP 128/81 | HR 89 | Ht 62.0 in | Wt 180.6 lb

## 2022-07-01 DIAGNOSIS — R5383 Other fatigue: Secondary | ICD-10-CM

## 2022-07-01 DIAGNOSIS — E7800 Pure hypercholesterolemia, unspecified: Secondary | ICD-10-CM

## 2022-07-01 DIAGNOSIS — Z23 Encounter for immunization: Secondary | ICD-10-CM

## 2022-07-01 DIAGNOSIS — E1165 Type 2 diabetes mellitus with hyperglycemia: Secondary | ICD-10-CM | POA: Diagnosis not present

## 2022-07-01 DIAGNOSIS — Z7984 Long term (current) use of oral hypoglycemic drugs: Secondary | ICD-10-CM

## 2022-07-01 DIAGNOSIS — Z1211 Encounter for screening for malignant neoplasm of colon: Secondary | ICD-10-CM

## 2022-07-01 MED ORDER — METFORMIN HCL 500 MG PO TABS
500.0000 mg | ORAL_TABLET | Freq: Two times a day (BID) | ORAL | 1 refills | Status: DC
Start: 2022-07-01 — End: 2022-08-25

## 2022-07-01 MED ORDER — ROSUVASTATIN CALCIUM 20 MG PO TABS
20.0000 mg | ORAL_TABLET | Freq: Every day | ORAL | 3 refills | Status: DC
Start: 2022-07-01 — End: 2022-08-25

## 2022-07-01 MED ORDER — EMPAGLIFLOZIN 10 MG PO TABS
10.0000 mg | ORAL_TABLET | Freq: Every day | ORAL | 1 refills | Status: DC
Start: 2022-07-01 — End: 2023-09-06

## 2022-07-01 NOTE — Progress Notes (Signed)
No concerns. 

## 2022-07-01 NOTE — Progress Notes (Signed)
Assessment & Plan:  Kenneth Ramirez was seen today for diabetes.  Diagnoses and all orders for this visit:  Type 2 diabetes mellitus with hyperglycemia, without long-term current use of insulin  Well controlled  -     Microalbumin / creatinine urine ratio -     CMP14+EGFR -     Hemoglobin A1c -     empagliflozin (JARDIANCE) 10 MG TABS tablet; Take 1 tablet (10 mg total) by mouth daily. -     metFORMIN (GLUCOPHAGE) 500 MG tablet; Take 1 tablet (500 mg total) by mouth 2 (two) times daily with a meal.  Colon cancer screening -     Ambulatory referral to Gastroenterology  Hypercholesterolemia INSTRUCTIONS: Work on a low fat, heart healthy diet and participate in regular aerobic exercise program by working out at least 150 minutes per week; 5 days a week-30 minutes per day. Avoid red meat/beef/steak,  fried foods. junk foods, sodas, sugary drinks, unhealthy snacking, alcohol and smoking.  Drink at least 80 oz of water per day and monitor your carbohydrate intake daily.   -     Lipid panel -     rosuvastatin (CRESTOR) 20 MG tablet; Take 1 tablet (20 mg total) by mouth daily.  Other fatigue -     CBC with Differential    Patient has been counseled on age-appropriate routine health concerns for screening and prevention. These are reviewed and up-to-date. Referrals have been placed accordingly. Immunizations are up-to-date or declined.    Subjective:   Chief Complaint  Patient presents with   Diabetes   [name] 52 y.o. male presents to office today accompanied by his daughter for follow up to DM We also had a discussion today regarding DMV forms need to be resubmitted.   DM 2 Diabetes is well-controlled with metformin 500 mg twice daily and Jardiance 10 mg daily. Lab Results  Component Value Date   HGBA1C 6.6 12/31/2021  LDL not quite at goal with rosuvastatin 20 mg daily. Lab Results  Component Value Date   LDLCALC 88 06/21/2021   BP Readings from Last 3 Encounters:   07/01/22 128/81  06/28/22 127/81  12/31/21 133/77     Review of Systems  Constitutional:  Positive for malaise/fatigue. Negative for fever and weight loss.  HENT: Negative.  Negative for nosebleeds.   Eyes: Negative.  Negative for blurred vision, double vision and photophobia.  Respiratory: Negative.  Negative for cough and shortness of breath.   Cardiovascular: Negative.  Negative for chest pain, palpitations and leg swelling.  Gastrointestinal: Negative.  Negative for heartburn, nausea and vomiting.  Musculoskeletal: Negative.  Negative for myalgias.  Neurological: Negative.  Negative for dizziness, focal weakness, seizures and headaches.  Psychiatric/Behavioral: Negative.  Negative for suicidal ideas.     Past Medical History:  Diagnosis Date   Anesthesia of skin    Diabetes (HCC)    Hypercholesteremia    Stroke (HCC) 2015    History reviewed. No pertinent surgical history.  Family History  Problem Relation Age of Onset   Diabetes Father     Social History Reviewed with no changes to be made today.   Outpatient Medications Prior to Visit  Medication Sig Dispense Refill   aspirin EC 81 MG tablet Take 1 tablet (81 mg total) by mouth daily. Swallow whole. 90 tablet 3   nystatin powder Apply 1 Application topically 3 (three) times daily. 60 g 1   empagliflozin (JARDIANCE) 10 MG TABS tablet Take 1 tablet (10 mg total) by  mouth daily. 90 tablet 1   metFORMIN (GLUCOPHAGE) 500 MG tablet Take 1 tablet (500 mg total) by mouth 2 (two) times daily with a meal. 180 tablet 1   rosuvastatin (CRESTOR) 20 MG tablet Take 1 tablet (20 mg total) by mouth daily. 90 tablet 3   Facility-Administered Medications Prior to Visit  Medication Dose Route Frequency Provider Last Rate Last Admin   triamcinolone acetonide (KENALOG) 10 MG/ML injection 20 mg  20 mg Other Once Kenneth Ramirez, DPM        No Known Allergies     Objective:    BP 128/81 (BP Location: Left Arm, Patient Position:  Sitting, Cuff Size: Normal)   Pulse 89   Ht 5\' 2"  (1.575 m)   Wt 180 lb 9.6 oz (81.9 kg)   SpO2 97%   BMI 33.03 kg/m  Wt Readings from Last 3 Encounters:  07/01/22 180 lb 9.6 oz (81.9 kg)  06/28/22 185 lb (83.9 kg)  12/31/21 176 lb 12.8 oz (80.2 kg)    Physical Exam Vitals and nursing note reviewed.  Constitutional:      Appearance: He is well-developed.  HENT:     Head: Normocephalic and atraumatic.  Cardiovascular:     Rate and Rhythm: Normal rate and regular rhythm.     Heart sounds: Normal heart sounds. No murmur heard.    No friction rub. No gallop.  Pulmonary:     Effort: Pulmonary effort is normal. No tachypnea or respiratory distress.     Breath sounds: Normal breath sounds. No decreased breath sounds, wheezing, rhonchi or rales.  Chest:     Chest wall: No tenderness.  Abdominal:     General: Bowel sounds are normal.     Palpations: Abdomen is soft.  Musculoskeletal:        General: Normal range of motion.     Cervical back: Normal range of motion.  Skin:    General: Skin is warm and dry.  Neurological:     Mental Status: He is alert and oriented to person, place, and time.     Coordination: Coordination normal.  Psychiatric:        Behavior: Behavior normal. Behavior is cooperative.        Thought Content: Thought content normal.        Judgment: Judgment normal.          Patient has been counseled extensively about nutrition and exercise as well as the importance of adherence with medications and regular follow-up. The patient was given clear instructions to go to ER or return to medical center if symptoms don't improve, worsen or new problems develop. The patient verbalized understanding.   Follow-up: Return in about 3 months (around 10/01/2022).   Claiborne Rigg, FNP-BC [facility] and [facility] [place], [place] [phone]   07/01/2022, 9:47 AM

## 2022-07-01 NOTE — Addendum Note (Signed)
Addended by: Arbie Cookey on: 07/01/2022 09:50 AM   Modules accepted: Orders

## 2022-07-02 LAB — CBC WITH DIFFERENTIAL/PLATELET
Basophils Absolute: 0 10*3/uL (ref 0.0–0.2)
Basos: 1 %
EOS (ABSOLUTE): 0.2 10*3/uL (ref 0.0–0.4)
Eos: 4 %
Hematocrit: 46 % (ref 37.5–51.0)
Hemoglobin: 14.9 g/dL (ref 13.0–17.7)
Immature Grans (Abs): 0 10*3/uL (ref 0.0–0.1)
Immature Granulocytes: 0 %
Lymphocytes Absolute: 2.4 10*3/uL (ref 0.7–3.1)
Lymphs: 41 %
MCH: 30.2 pg (ref 26.6–33.0)
MCHC: 32.4 g/dL (ref 31.5–35.7)
MCV: 93 fL (ref 79–97)
Monocytes Absolute: 0.5 10*3/uL (ref 0.1–0.9)
Monocytes: 9 %
Neutrophils Absolute: 2.7 10*3/uL (ref 1.4–7.0)
Neutrophils: 45 %
Platelets: 209 10*3/uL (ref 150–450)
RBC: 4.94 x10E6/uL (ref 4.14–5.80)
RDW: 13.1 % (ref 11.6–15.4)
WBC: 5.8 10*3/uL (ref 3.4–10.8)

## 2022-07-02 LAB — CMP14+EGFR
ALT: 19 IU/L (ref 0–44)
AST: 20 IU/L (ref 0–40)
Albumin/Globulin Ratio: 1.6 (ref 1.2–2.2)
Albumin: 4.7 g/dL (ref 3.8–4.9)
Alkaline Phosphatase: 116 IU/L (ref 44–121)
BUN/Creatinine Ratio: 16 (ref 9–20)
BUN: 16 mg/dL (ref 6–24)
Bilirubin Total: 0.2 mg/dL (ref 0.0–1.2)
CO2: 19 mmol/L — ABNORMAL LOW (ref 20–29)
Calcium: 9.7 mg/dL (ref 8.7–10.2)
Chloride: 106 mmol/L (ref 96–106)
Creatinine, Ser: 1.01 mg/dL (ref 0.76–1.27)
Globulin, Total: 3 g/dL (ref 1.5–4.5)
Glucose: 115 mg/dL — ABNORMAL HIGH (ref 70–99)
Potassium: 4.3 mmol/L (ref 3.5–5.2)
Sodium: 142 mmol/L (ref 134–144)
Total Protein: 7.7 g/dL (ref 6.0–8.5)
eGFR: 89 mL/min/{1.73_m2} (ref >59)

## 2022-07-02 LAB — LIPID PANEL
Chol/HDL Ratio: 4.4 ratio (ref 0.0–5.0)
Cholesterol, Total: 162 mg/dL (ref 100–199)
HDL: 37 mg/dL — ABNORMAL LOW (ref >39)
LDL Chol Calc (NIH): 99 mg/dL (ref 0–99)
Triglycerides: 144 mg/dL (ref 0–149)
VLDL Cholesterol Cal: 26 mg/dL (ref 5–40)

## 2022-07-02 LAB — HEMOGLOBIN A1C
Est. average glucose Bld gHb Est-mCnc: 157 mg/dL
Hgb A1c MFr Bld: 7.1 % — ABNORMAL HIGH (ref 4.8–5.6)

## 2022-07-02 LAB — MICROALBUMIN / CREATININE URINE RATIO
Creatinine, Urine: 124.3 mg/dL
Microalb/Creat Ratio: 11 mg/g creat (ref 0–29)
Microalbumin, Urine: 13.9 ug/mL

## 2022-08-17 ENCOUNTER — Telehealth: Payer: Self-pay

## 2022-08-17 NOTE — Telephone Encounter (Signed)
Call returned to niece and forms will be faxed once completed.

## 2022-08-17 NOTE — Telephone Encounter (Signed)
Copied from CRM [id number]. Topic: General - Other >> Aug 16, 2022 12:49 PM Santiya F wrote: Reason for CRM: Pt's niece and pt are calling in requesting that Bertram Denver or a nurse in office give them a call. Pt's niece says the matter is urgent and her and the pt need to speak with someone immediately. Pt's niece says to call on this number  [phone]

## 2022-08-25 ENCOUNTER — Other Ambulatory Visit: Payer: Self-pay | Admitting: Nurse Practitioner

## 2022-08-25 DIAGNOSIS — E7800 Pure hypercholesterolemia, unspecified: Secondary | ICD-10-CM

## 2022-08-25 DIAGNOSIS — E1165 Type 2 diabetes mellitus with hyperglycemia: Secondary | ICD-10-CM
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
## 2023-09-05 ENCOUNTER — Telehealth: Payer: Self-pay | Admitting: Nurse Practitioner

## 2023-09-05 NOTE — Telephone Encounter (Signed)
 Pt confirmed appt

## 2023-09-06 ENCOUNTER — Encounter: Payer: Self-pay | Admitting: Nurse Practitioner

## 2023-09-06 ENCOUNTER — Ambulatory Visit: Attending: Nurse Practitioner | Admitting: Nurse Practitioner

## 2023-09-06 VITALS — BP 146/79 | HR 87 | Resp 19 | Ht 62.0 in | Wt 181.6 lb

## 2023-09-06 DIAGNOSIS — E7800 Pure hypercholesterolemia, unspecified: Secondary | ICD-10-CM | POA: Diagnosis not present

## 2023-09-06 DIAGNOSIS — E1165 Type 2 diabetes mellitus with hyperglycemia: Secondary | ICD-10-CM

## 2023-09-06 DIAGNOSIS — Z7984 Long term (current) use of oral hypoglycemic drugs: Secondary | ICD-10-CM | POA: Diagnosis not present

## 2023-09-06 DIAGNOSIS — R030 Elevated blood-pressure reading, without diagnosis of hypertension: Secondary | ICD-10-CM

## 2023-09-06 DIAGNOSIS — G629 Polyneuropathy, unspecified: Secondary | ICD-10-CM

## 2023-09-06 DIAGNOSIS — Z1211 Encounter for screening for malignant neoplasm of colon: Secondary | ICD-10-CM

## 2023-09-06 DIAGNOSIS — R5383 Other fatigue: Secondary | ICD-10-CM

## 2023-09-06 LAB — POCT GLYCOSYLATED HEMOGLOBIN (HGB A1C): Hemoglobin A1C: 7.2 % — AB (ref 4.0–5.6)

## 2023-09-06 MED ORDER — GABAPENTIN 100 MG PO CAPS: 100.0000 mg | ORAL_CAPSULE | Freq: Three times a day (TID) | ORAL | 1 refills | Status: AC

## 2023-09-06 MED ORDER — METFORMIN HCL 500 MG PO TABS
500.0000 mg | ORAL_TABLET | Freq: Two times a day (BID) | ORAL | 1 refills | Status: AC
Start: 2023-09-06 — End: ?

## 2023-09-06 MED ORDER — EMPAGLIFLOZIN 10 MG PO TABS
10.0000 mg | ORAL_TABLET | Freq: Every day | ORAL | 1 refills | Status: AC
Start: 2023-09-06 — End: ?

## 2023-09-06 MED ORDER — ROSUVASTATIN CALCIUM 20 MG PO TABS
20.0000 mg | ORAL_TABLET | Freq: Every day | ORAL | 1 refills | Status: AC
Start: 2023-09-06 — End: ?

## 2023-09-06 MED ORDER — LISINOPRIL 5 MG PO TABS: 5.0000 mg | ORAL_TABLET | Freq: Every day | ORAL | 3 refills | Status: AC
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
## 2023-09-08 LAB — CBC WITH DIFFERENTIAL/PLATELET
Basophils Absolute: 0 x10E3/uL (ref 0.0–0.2)
Basos: 0 %
EOS (ABSOLUTE): 0.2 x10E3/uL (ref 0.0–0.4)
Eos: 4 %
Hematocrit: 43 % (ref 37.5–51.0)
Hemoglobin: 14.4 g/dL (ref 13.0–17.7)
Immature Grans (Abs): 0 x10E3/uL (ref 0.0–0.1)
Immature Granulocytes: 0 %
Lymphocytes Absolute: 1.9 x10E3/uL (ref 0.7–3.1)
Lymphs: 37 %
MCH: 30.6 pg (ref 26.6–33.0)
MCHC: 33.5 g/dL (ref 31.5–35.7)
MCV: 92 fL (ref 79–97)
Monocytes Absolute: 0.4 x10E3/uL (ref 0.1–0.9)
Monocytes: 9 %
Neutrophils Absolute: 2.6 x10E3/uL (ref 1.4–7.0)
Neutrophils: 50 %
Platelets: 204 x10E3/uL (ref 150–450)
RBC: 4.7 x10E6/uL (ref 4.14–5.80)
RDW: 13.5 % (ref 11.6–15.4)
WBC: 5.2 x10E3/uL (ref 3.4–10.8)

## 2023-09-08 LAB — LIPID PANEL
Chol/HDL Ratio: 3.7 ratio (ref 0.0–5.0)
Cholesterol, Total: 144 mg/dL (ref 100–199)
HDL: 39 mg/dL — ABNORMAL LOW (ref >39)
LDL Chol Calc (NIH): 81 mg/dL (ref 0–99)
Triglycerides: 137 mg/dL (ref 0–149)
VLDL Cholesterol Cal: 24 mg/dL (ref 5–40)

## 2023-09-08 LAB — MICROALBUMIN / CREATININE URINE RATIO
Creatinine, Urine: 157.7 mg/dL
Microalb/Creat Ratio: 17 mg/g{creat} (ref 0–29)
Microalbumin, Urine: 26.5 ug/mL

## 2023-09-08 LAB — CMP14+EGFR
ALT: 20 IU/L (ref 0–44)
AST: 18 IU/L (ref 0–40)
Albumin: 4.4 g/dL (ref 3.8–4.9)
Alkaline Phosphatase: 116 IU/L (ref 44–121)
BUN/Creatinine Ratio: 12 (ref 9–20)
BUN: 11 mg/dL (ref 6–24)
Bilirubin Total: 0.3 mg/dL (ref 0.0–1.2)
CO2: 19 mmol/L — ABNORMAL LOW (ref 20–29)
Calcium: 9.5 mg/dL (ref 8.7–10.2)
Chloride: 104 mmol/L (ref 96–106)
Creatinine, Ser: 0.91 mg/dL (ref 0.76–1.27)
Globulin, Total: 2.9 g/dL (ref 1.5–4.5)
Glucose: 126 mg/dL — ABNORMAL HIGH (ref 70–99)
Potassium: 4.2 mmol/L (ref 3.5–5.2)
Sodium: 139 mmol/L (ref 134–144)
Total Protein: 7.3 g/dL (ref 6.0–8.5)
eGFR: 101 mL/min/1.73 (ref >59)

## 2023-09-23 ENCOUNTER — Ambulatory Visit: Payer: Self-pay | Admitting: Nurse Practitioner

## 2023-10-10 ENCOUNTER — Telehealth: Payer: Self-pay | Admitting: Nurse Practitioner

## 2023-10-10 NOTE — Telephone Encounter (Signed)
 Called patient to confirm upcoming appointment 10/12/2023, no answer. Unable to leave VM.

## 2023-10-12 ENCOUNTER — Encounter: Payer: Self-pay | Admitting: Pharmacist

## 2023-10-12 ENCOUNTER — Ambulatory Visit: Attending: Family Medicine | Admitting: Pharmacist

## 2023-10-12 VITALS — BP 118/71

## 2023-10-12 DIAGNOSIS — R030 Elevated blood-pressure reading, without diagnosis of hypertension: Secondary | ICD-10-CM | POA: Diagnosis not present

## 2023-10-12 DIAGNOSIS — Z7984 Long term (current) use of oral hypoglycemic drugs: Secondary | ICD-10-CM

## 2023-10-12 DIAGNOSIS — E1165 Type 2 diabetes mellitus with hyperglycemia: Secondary | ICD-10-CM | POA: Diagnosis not present

## 2023-10-12 NOTE — Progress Notes (Signed)
   [id number], Kenneth Ramirez  S:     No chief complaint on file.  53 y.o. male who presents for diabetes evaluation, education, and management. Patient arrives in  good spirits and presents without any assistance.  Patient was referred and last seen by Primary Care Provider, Haze Servant, on 09/06/2023. A1c at that visit was 7.2%. BP was 146/79 mmHg. Lisinopril  was started. Metformin  was continued.   PMH is significant for T2DM, HTN, HLD, history of CVA (2015). Patient reports Diabetes was diagnosed several years ago. He has no hx of ACS/known CAD, or CKD. No known hx of pancreatitis or thyroid cancer. Never been on insulin or other injectable therapy. He endorses adherence to his Jardiance  and metformin .   Regarding his HTN, he has not taken the lisinopril  today. Tells me he has not been taking the lisinopril  even though this was dispensed from his pharmacy last month.   Family/Social History:  Fhx: DM Tobacco hx: never smoker  Alcohol: none reported   Current diabetes medications include: metformin  500 mg BID, Jardiance  10 mg daily Current hypertension medications include: lisinopril  5 mg daily  Current hyperlipidemia medications include: rosuvastatin  20 mg daily  Patient reports adherence to taking all medications as prescribed.   Insurance coverage: [employer]  Patient denies hypoglycemic events.  Reported home fasting blood sugars: none  Reported 2 hour post-meal/random blood sugars: none.  Patient denies polyuria. Patient reports neuropathy (nerve pain). Patient reports visual changes. Patient reports self foot exams.   Patient reported dietary habits: Eats 2 meals/day Breakfast: rice, fish, fava beans  Lunch: skips Dinner: rice, fish, fava beans Snacks: fruits, sweets Drinks: tea, coffee, water  Patient-reported exercise habits:  -Plays volleyball and soccer at home  O:   Lab Results  Component Value Date   HGBA1C 7.2 (A) 09/06/2023   Vitals:   10/12/23 1029  BP:  118/71    Lipid Panel     Component Value Date/Time   CHOL 144 09/06/2023 0936   TRIG 137 09/06/2023 0936   HDL 39 (L) 09/06/2023 0936   CHOLHDL 3.7 09/06/2023 0936   LDLCALC 81 09/06/2023 0936    Clinical Atherosclerotic Cardiovascular Disease (ASCVD): YES The ASCVD Risk score (Arnett DK, et al., 2019) failed to calculate for the following reasons:   Risk score cannot be calculated because patient has a medical history suggesting prior/existing ASCVD   Patient is participating in a Managed Medicaid Plan: No   A/P: Diabetes longstanding currently close to goal. Patient is able to verbalize appropriate hypoglycemia management plan. He is not symptomatic at this time but is not checking home CBG readings. Medication adherence appears to be optimal. -Continued current regimen. -Extensively discussed pathophysiology of diabetes, recommended lifestyle interventions, dietary effects on blood sugar control.  -Counseled on s/sx of and management of hypoglycemia.  -Next A1c anticipated 11/2023.   Elevated blood pressure without the dx of HTN. Today's BP is normotensive. Blood pressure goal of <120/80 mmHg. Medication adherence denied with lisinopril . Will hold off on him taking this and have him return for a recheck in 1 month.  Written patient instructions provided. Patient verbalized understanding of treatment plan.  Total time in face to face counseling 30 minutes.    Follow-up:  Pharmacist 1 month.  Herlene Fleeta Morris, PharmD, JAQUELINE, CPP Clinical Pharmacist [facility] & [facility] [phone]

## 2023-10-27 ENCOUNTER — Other Ambulatory Visit: Payer: Self-pay | Admitting: Family Medicine

## 2023-10-27 DIAGNOSIS — E7800 Pure hypercholesterolemia, unspecified: Secondary | ICD-10-CM

## 2023-10-27 DIAGNOSIS — E1165 Type 2 diabetes mellitus with hyperglycemia: Secondary | ICD-10-CM

## 2023-10-30 NOTE — Telephone Encounter (Signed)
 Metformin - 09/06/23 #180 1RF-too soon Rosuvastatin - 09/06/23 #90 1RF- too soon Requested Prescriptions  Pending Prescriptions Disp Refills   metFORMIN  (GLUCOPHAGE ) 500 MG tablet [Pharmacy Med Name: METFORMIN  500MG  TABLETS] 180 tablet 1    Sig: TAKE 1 TABLET(500 MG) BY MOUTH TWICE DAILY WITH A MEAL     Endocrinology:  Diabetes - Biguanides Failed - 10/30/2023  1:00 PM      Failed - B12 Level in normal range and within 720 days    No results found for: VITAMINB12       Passed - Cr in normal range and within 360 days    Creatinine, Ser  Date Value Ref Range Status  09/06/2023 0.91 0.76 - 1.27 mg/dL Final         Passed - HBA1C is between 0 and 7.9 and within 180 days    Hemoglobin A1C  Date Value Ref Range Status  09/06/2023 7.2 (A) 4.0 - 5.6 % Final   HbA1c, POC (controlled diabetic range)  Date Value Ref Range Status  12/31/2021 6.6 0.0 - 7.0 % Final   Hgb A1c MFr Bld  Date Value Ref Range Status  07/01/2022 7.1 (H) 4.8 - 5.6 % Final    Comment:             Prediabetes: 5.7 - 6.4          Diabetes: >6.4          Glycemic control for adults with diabetes: <7.0          Passed - eGFR in normal range and within 360 days    eGFR  Date Value Ref Range Status  09/06/2023 101 >59 mL/min/1.73 Final         Passed - Valid encounter within last 6 months    Recent Outpatient Visits           2 weeks ago Type 2 diabetes mellitus with hyperglycemia, without long-term current use of insulin (HCC)   [facility] [place]. [facility] Fleeta Morris, [place] L, RPH-CPP   1 month ago Type 2 diabetes mellitus with hyperglycemia, without long-term current use of insulin (HCC)   [facility] Dept Of [place]. [facility] [place], [place] W, NP   1 year ago Type 2 diabetes mellitus with hyperglycemia, without long-term current use of insulin (HCC)   [facility] Dept Of [place]. [facility] [place], [place] W, NP   1 year ago Type 2 diabetes mellitus with hyperglycemia, without long-term current use of insulin (HCC)   [facility] Dept Of [place]. [facility] Theotis Haze ORN, NP   2 years ago Encounter for completion of form with patient    [facility] Dept Of [place]. [facility] [place], [place] W, NP              Passed - CBC within normal limits and completed in the last 12 months    WBC  Date Value Ref Range Status  09/06/2023 5.2 3.4 - 10.8 x10E3/uL Final   RBC  Date Value Ref Range Status  09/06/2023 4.70 4.14 - 5.80 x10E6/uL Final   Hemoglobin  Date Value Ref Range Status  09/06/2023 14.4 13.0 - 17.7 g/dL Final   Hematocrit  Date Value Ref Range Status  09/06/2023 43.0 37.5 - 51.0 % Final  MCHC  Date Value Ref Range Status  09/06/2023 33.5 31.5 - 35.7 g/dL Final   [facility]  Date Value Ref Range Status  09/06/2023 30.6 26.6 - 33.0 pg Final   MCV  Date Value Ref Range Status  09/06/2023 92 79 - 97 fL Final   No results found for: PLTCOUNTKUC, LABPLAT, POCPLA RDW  Date Value Ref Range Status  09/06/2023 13.5 11.6 - 15.4 % Final          rosuvastatin  (CRESTOR ) 20 MG tablet [Pharmacy Med Name: ROSUVASTATIN  20MG  TABLETS] 90 tablet 1    Sig: TAKE 1 TABLET BY MOUTH DAILY     Cardiovascular:  Antilipid - Statins 2 Failed - 10/30/2023  1:00 PM      Failed - Lipid Panel in normal range within the last 12 months    Cholesterol, Total  Date Value Ref Range Status  09/06/2023 144 100 - 199 mg/dL Final   LDL Chol Calc (NIH)  Date Value Ref Range Status  09/06/2023 81 0 - 99 mg/dL Final   HDL  Date Value Ref Range Status  09/06/2023 39 (L) >39 mg/dL Final   Triglycerides  Date Value Ref Range Status  09/06/2023 137 0 - 149 mg/dL Final         Passed - Cr in normal range and within 360 days    Creatinine, Ser  Date Value Ref Range Status  09/06/2023 0.91 0.76 -  1.27 mg/dL Final         Passed - Patient is not pregnant      Passed - Valid encounter within last 12 months    Recent Outpatient Visits           2 weeks ago Type 2 diabetes mellitus with hyperglycemia, without long-term current use of insulin (HCC)   [facility] Dept Of [place]. [facility] Fleeta Morris, [place] L, RPH-CPP   1 month ago Type 2 diabetes mellitus with hyperglycemia, without long-term current use of insulin (HCC)   [facility] Dept Of [place]. [facility] [place], [place] W, NP   1 year ago Type 2 diabetes mellitus with hyperglycemia, without long-term current use of insulin (HCC)   [facility] Dept Of [place]. [facility] [place], [place] W, NP   1 year ago Type 2 diabetes mellitus with hyperglycemia, without long-term current use of insulin (HCC)   [facility] Dept Of [place]. [facility] Theotis Haze ORN, NP   2 years ago Encounter for completion of form with patient   [facility] - [facility] Dept Of [place]. [facility] Theotis Haze ORN, [place]

## 2023-11-22 ENCOUNTER — Telehealth: Payer: Self-pay | Admitting: Nurse Practitioner

## 2023-11-22 NOTE — Telephone Encounter (Signed)
 Voicemail left by volunteer to confirm patient's appointment for 11/24/2023.

## 2023-11-24 ENCOUNTER — Ambulatory Visit: Payer: Self-pay | Attending: Family Medicine | Admitting: Pharmacist

## 2023-11-24 ENCOUNTER — Encounter: Payer: Self-pay | Admitting: Pharmacist

## 2023-11-24 VITALS — BP 131/76 | HR 78

## 2023-11-24 DIAGNOSIS — Z7984 Long term (current) use of oral hypoglycemic drugs: Secondary | ICD-10-CM | POA: Diagnosis not present

## 2023-11-24 DIAGNOSIS — R030 Elevated blood-pressure reading, without diagnosis of hypertension: Secondary | ICD-10-CM | POA: Diagnosis not present

## 2023-11-24 DIAGNOSIS — E1165 Type 2 diabetes mellitus with hyperglycemia: Secondary | ICD-10-CM

## 2023-11-24 NOTE — Progress Notes (Signed)
 [id number], Kenneth Ramirez  S:     No chief complaint on file.  53 y.o. male who presents for diabetes and HTN evaluation, education, and management. Patient arrives in  good spirits and presents without any assistance.  Patient was referred and last seen by Primary Care Provider, Haze Servant, on 09/06/2023. A1c at that visit was 7.2%. BP was 146/79 mmHg. Lisinopril  was started. Metformin  was continued.   I saw him on 10/12/23 and pt reported that he was not taking lisinopril . BP at the time was 118/71 mmHg. I told him to continue without the lisinopril  and return today for a recheck.   Regarding his HTN, he has not taken the lisinopril  today. He continues without the lisinopril . Brings his wrist cuff in for verification.  Family/Social History:  Fhx: DM Tobacco hx: never smoker  Alcohol: none reported   Current diabetes medications include: metformin  500 mg BID, Jardiance  10 mg daily Current hypertension medications include: lisinopril  5 mg daily (not taking) Current hyperlipidemia medications include: rosuvastatin  20 mg daily  Patient reports adherence to taking all medications as prescribed.   Insurance coverage: [employer]  Patient denies hypoglycemic events.  Patient denies polyuria. Patient reports neuropathy (nerve pain). Patient reports visual changes. Patient reports self foot exams.   Patient reported dietary habits: Eats 2 meals/day Breakfast: rice, fish, fava beans  Lunch: skips Dinner: rice, fish, fava beans Snacks: fruits, sweets Drinks: tea, coffee, water  Patient-reported exercise habits:  -Plays volleyball and soccer at home  O:  Brings his meter for review.  30-day avg: 115 mg/dl -Range: 84 - 826. Mostly in the 100s-120s.   Lab Results  Component Value Date   HGBA1C 7.2 (A) 09/06/2023   Vitals:   11/24/23 1132  BP: 131/76  Pulse: 78   Home cuff: 150/92 mmHg.  Lipid Panel     Component Value Date/Time   CHOL 144 09/06/2023 0936   TRIG 137  09/06/2023 0936   HDL 39 (L) 09/06/2023 0936   CHOLHDL 3.7 09/06/2023 0936   LDLCALC 81 09/06/2023 0936    PREVENt CVD: 5.6% over 10 years  Clinical Atherosclerotic Cardiovascular Disease (ASCVD): YES The ASCVD Risk score (Arnett DK, et al., 2019) failed to calculate for the following reasons:   Risk score cannot be calculated because patient has a medical history suggesting prior/existing ASCVD   Patient is participating in a Managed Medicaid Plan: No   A/P: Diabetes longstanding currently close to goal. Patient is able to verbalize appropriate hypoglycemia management plan. He is not symptomatic at this time. Home CBG readings look to be at goal. Avg is at goal over the last 30 days. Medication adherence appears to be optimal. -Continued current regimen. -Extensively discussed pathophysiology of diabetes, recommended lifestyle interventions, dietary effects on blood sugar control.  -Counseled on s/sx of and management of hypoglycemia.  -Next A1c anticipated 11/2023.   Elevated blood pressure without the dx of HTN. Today's BP is slightly elevated but better than the 146/79 mmHg he had back in July. Home cuff is not accurate and reading falsely high. Advised him to purchase a brachial cuff to use instead. Medication adherence denied with lisinopril . Will hold off on him taking this. Of note, the 2025 ACC/AHA HTN guidelines have changed wording and recommendations for BP management in the setting of DM. They recommend to initiate treatment if avg BP is 130/80 or above. His BP today is 131/76 mmHg with my cuff. I had him at 118/71 in August for an avg of 124/73 mmHg.  -  Continue to hold lisinopril  for now.  -Will continue to monitor. He may need in the future. Advised him to check using a brachial cuff at home. He is to bring readings to [facility] later this month.  Written patient instructions provided. Patient verbalized understanding of treatment plan.  Total time in face to face counseling 30  minutes.    Follow-up:  Pharmacist prn. PCP: 12/08/23  Herlene Fleeta Morris, PharmD, BCACP, CPP Clinical Pharmacist [facility] [phone]

## 2023-12-08 ENCOUNTER — Ambulatory Visit: Payer: Self-pay | Admitting: Nurse Practitioner

## 2023-12-22 ENCOUNTER — Encounter: Admitting: Family

## 2023-12-22 NOTE — Progress Notes (Signed)
 Erroneous encounter-disregard
